# Patient Record
Sex: Female | Born: 1960 | Race: White | Hispanic: No | Marital: Married | Smoking: Never smoker
Health system: Southern US, Community
[De-identification: ages and names within clinical notes are randomized; demographics above are authoritative.]

## PROBLEM LIST (undated history)

## (undated) DIAGNOSIS — F329 Major depressive disorder, single episode, unspecified: Secondary | ICD-10-CM

## (undated) DIAGNOSIS — E039 Hypothyroidism, unspecified: Secondary | ICD-10-CM

## (undated) DIAGNOSIS — F32A Depression, unspecified: Secondary | ICD-10-CM

## (undated) DIAGNOSIS — G4730 Sleep apnea, unspecified: Secondary | ICD-10-CM

## (undated) HISTORY — PX: ABDOMINAL HYSTERECTOMY: SHX81

---

## 1999-02-06 ENCOUNTER — Other Ambulatory Visit: Admission: RE | Admit: 1999-02-06 | Discharge: 1999-02-06 | Payer: Self-pay | Admitting: *Deleted

## 2000-02-09 ENCOUNTER — Ambulatory Visit (HOSPITAL_COMMUNITY): Admission: RE | Admit: 2000-02-09 | Discharge: 2000-02-09 | Payer: Self-pay | Admitting: Gastroenterology

## 2000-04-25 ENCOUNTER — Other Ambulatory Visit: Admission: RE | Admit: 2000-04-25 | Discharge: 2000-04-25 | Payer: Self-pay | Admitting: *Deleted

## 2001-05-15 ENCOUNTER — Other Ambulatory Visit: Admission: RE | Admit: 2001-05-15 | Discharge: 2001-05-15 | Payer: Self-pay | Admitting: *Deleted

## 2002-05-08 ENCOUNTER — Other Ambulatory Visit: Admission: RE | Admit: 2002-05-08 | Discharge: 2002-05-08 | Payer: Self-pay | Admitting: *Deleted

## 2005-05-29 ENCOUNTER — Other Ambulatory Visit: Admission: RE | Admit: 2005-05-29 | Discharge: 2005-05-29 | Payer: Self-pay | Admitting: Obstetrics and Gynecology

## 2005-12-05 ENCOUNTER — Observation Stay (HOSPITAL_COMMUNITY): Admission: RE | Admit: 2005-12-05 | Discharge: 2005-12-06 | Payer: Self-pay | Admitting: Obstetrics and Gynecology

## 2007-04-09 ENCOUNTER — Encounter: Admission: RE | Admit: 2007-04-09 | Discharge: 2007-04-09 | Payer: Self-pay | Admitting: Obstetrics and Gynecology

## 2012-12-03 HISTORY — PX: REDUCTION MAMMAPLASTY: SUR839

## 2014-03-01 ENCOUNTER — Encounter (HOSPITAL_COMMUNITY): Payer: Self-pay | Admitting: Pharmacy Technician

## 2014-03-01 NOTE — Pre-Procedure Instructions (Signed)
Aleisha Paone  03/01/2014  Your procedure is scheduled on:  Thursday, April 9th   Report to [facility]  2 * 3 at  5:30 AM.   Call this number if you have problems the morning of surgery: [phone]   Remember:   Do not eat food or drink liquids after midnight Wednesday.   Take these medicines the morning of surgery with A SIP OF WATER: Cymbalta, Levothyroxine, Zyrtec   Do not wear jewelry, make-up or nail polish.  Do not wear lotions, powders, or perfumes. You may NOT wear deodorant.  Do not shave underarms & legs 48 hours prior to surgery.    Do not bring valuables to the hospital.  [facility] is not responsible for any belongings or valuables.               Contacts, dentures or bridgework may not be worn into surgery.  Leave suitcase in the car. After surgery it may be brought to your room.  For patients admitted to the hospital, discharge time is determined by your                treatment team.              Name and phone number of your driver:    Special Instructions:  "Preparing for Surgery" instruction sheet.   Please read over the following fact sheets that you were given: Pain Booklet and Surgical Site Infection Prevention

## 2014-03-02 ENCOUNTER — Encounter (HOSPITAL_COMMUNITY)
Admission: RE | Admit: 2014-03-02 | Discharge: 2014-03-02 | Disposition: A | Discharge status: Home / Self Care | Payer: 59 | Source: Ambulatory Visit | Attending: Plastic Surgery | Admitting: Plastic Surgery

## 2014-03-02 ENCOUNTER — Encounter (HOSPITAL_COMMUNITY): Payer: Self-pay

## 2014-03-02 DIAGNOSIS — Z01812 Encounter for preprocedural laboratory examination: Secondary | ICD-10-CM | POA: Insufficient documentation

## 2014-03-02 HISTORY — DX: Sleep apnea, unspecified: G47.30

## 2014-03-02 HISTORY — DX: Major depressive disorder, single episode, unspecified: F32.9

## 2014-03-02 HISTORY — DX: Depression, unspecified: F32.A

## 2014-03-02 HISTORY — DX: Hypothyroidism, unspecified: E03.9

## 2014-03-02 LAB — COMPREHENSIVE METABOLIC PANEL
ALBUMIN: 3.5 g/dL (ref 3.5–5.2)
ALT: 35 U/L (ref 0–35)
AST: 22 U/L (ref 0–37)
Alkaline Phosphatase: 87 U/L (ref 39–117)
BILIRUBIN TOTAL: 0.3 mg/dL (ref 0.3–1.2)
BUN: 12 mg/dL (ref 6–23)
CHLORIDE: 103 meq/L (ref 96–112)
CO2: 26 meq/L (ref 19–32)
CREATININE: 0.62 mg/dL (ref 0.50–1.10)
Calcium: 9.5 mg/dL (ref 8.4–10.5)
GFR calc Af Amer: >90 mL/min (ref >90)
Glucose, Bld: 127 mg/dL — ABNORMAL HIGH (ref 70–99)
Potassium: 4.5 mEq/L (ref 3.7–5.3)
SODIUM: 140 meq/L (ref 137–147)
Total Protein: 7.4 g/dL (ref 6.0–8.3)

## 2014-03-02 LAB — CBC
HCT: 39 % (ref 36.0–46.0)
Hemoglobin: 12.7 g/dL (ref 12.0–15.0)
MCH: 28.7 pg (ref 26.0–34.0)
MCHC: 32.6 g/dL (ref 30.0–36.0)
MCV: 88.2 fL (ref 78.0–100.0)
PLATELETS: 308 10*3/uL (ref 150–400)
RBC: 4.42 MIL/uL (ref 3.87–5.11)
RDW: 13.1 % (ref 11.5–15.5)
WBC: 8.9 10*3/uL (ref 4.0–10.5)

## 2014-03-02 NOTE — Progress Notes (Addendum)
Patient had sleep study testing about 6-7 yrs ago.  "Somewhere in [place]"  Instructed her to bring mask. She mainly sees Dr. Forde Dandy for any health issues. Denies any cardiac issues.  I had no orders at PAT appointment.   DA

## 2014-03-03 ENCOUNTER — Other Ambulatory Visit: Payer: Self-pay | Admitting: Plastic Surgery

## 2014-03-10 ENCOUNTER — Encounter (HOSPITAL_COMMUNITY): Payer: Self-pay | Admitting: Anesthesiology

## 2014-03-10 MED ORDER — CEFAZOLIN SODIUM-DEXTROSE 2-3 GM-% IV SOLR
2.0000 g | INTRAVENOUS | Status: AC
  Administered 2014-03-11: 2 g via INTRAVENOUS
  Filled 2014-03-10: qty 50

## 2014-03-10 MED ORDER — CHLORHEXIDINE GLUCONATE 4 % EX LIQD
1.0000 "application " | Freq: Once | CUTANEOUS | Status: DC
  Filled 2014-03-10: qty 15

## 2014-03-10 MED ORDER — HEPARIN SODIUM (PORCINE) 5000 UNIT/ML IJ SOLN
5000.0000 [IU] | Freq: Once | INTRAMUSCULAR | Status: AC
  Administered 2014-03-11: 5000 [IU] via SUBCUTANEOUS
  Filled 2014-03-10: qty 1

## 2014-03-11 ENCOUNTER — Ambulatory Visit (HOSPITAL_COMMUNITY)
Admission: RE | Admit: 2014-03-11 | Discharge: 2014-03-12 | Disposition: A | Discharge status: Home / Self Care | Payer: 59 | Source: Ambulatory Visit | Attending: Plastic Surgery | Admitting: Plastic Surgery

## 2014-03-11 ENCOUNTER — Encounter (HOSPITAL_COMMUNITY)
Admission: RE | Disposition: A | Discharge status: Home / Self Care | Payer: Self-pay | Source: Ambulatory Visit | Attending: Plastic Surgery

## 2014-03-11 ENCOUNTER — Ambulatory Visit (HOSPITAL_COMMUNITY): Payer: 59 | Admitting: Anesthesiology

## 2014-03-11 ENCOUNTER — Encounter (HOSPITAL_COMMUNITY): Payer: 59 | Admitting: Anesthesiology

## 2014-03-11 ENCOUNTER — Encounter (HOSPITAL_COMMUNITY): Payer: Self-pay | Admitting: *Deleted

## 2014-03-11 DIAGNOSIS — N62 Hypertrophy of breast: Secondary | ICD-10-CM | POA: Diagnosis present

## 2014-03-11 DIAGNOSIS — E669 Obesity, unspecified: Secondary | ICD-10-CM | POA: Insufficient documentation

## 2014-03-11 HISTORY — PX: BREAST REDUCTION SURGERY: SHX8

## 2014-03-11 SURGERY — MAMMOPLASTY, REDUCTION
Anesthesia: General | Site: Breast | Laterality: Bilateral

## 2014-03-11 MED ORDER — DEXTROSE-NACL 5-0.45 % IV SOLN
INTRAVENOUS | Status: DC
  Administered 2014-03-11 – 2014-03-12 (×2): via INTRAVENOUS

## 2014-03-11 MED ORDER — DOCUSATE SODIUM 100 MG PO CAPS
100.0000 mg | ORAL_CAPSULE | Freq: Every day | ORAL | Status: DC
  Administered 2014-03-12: 100 mg via ORAL
  Filled 2014-03-11: qty 1

## 2014-03-11 MED ORDER — ROCURONIUM BROMIDE 100 MG/10ML IV SOLN
INTRAVENOUS | Status: DC | PRN
  Administered 2014-03-11: 50 mg via INTRAVENOUS

## 2014-03-11 MED ORDER — NEOSTIGMINE METHYLSULFATE 1 MG/ML IJ SOLN
INTRAMUSCULAR | Status: AC
  Filled 2014-03-11: qty 10

## 2014-03-11 MED ORDER — MIDAZOLAM HCL 2 MG/2ML IJ SOLN
INTRAMUSCULAR | Status: AC
  Filled 2014-03-11: qty 2

## 2014-03-11 MED ORDER — ONDANSETRON HCL 4 MG/2ML IJ SOLN
INTRAMUSCULAR | Status: AC
  Filled 2014-03-11: qty 2

## 2014-03-11 MED ORDER — PROPOFOL 10 MG/ML IV BOLUS
INTRAVENOUS | Status: DC | PRN
  Administered 2014-03-11: 150 mg via INTRAVENOUS

## 2014-03-11 MED ORDER — LEVOTHYROXINE SODIUM 200 MCG PO TABS
200.0000 ug | ORAL_TABLET | Freq: Every day | ORAL | Status: DC
  Administered 2014-03-12: 200 ug via ORAL
  Filled 2014-03-11 (×2): qty 1

## 2014-03-11 MED ORDER — HYDROMORPHONE HCL 2 MG PO TABS
4.0000 mg | ORAL_TABLET | Freq: Once | ORAL | Status: AC
  Administered 2014-03-11: 4 mg via ORAL
  Filled 2014-03-11: qty 2

## 2014-03-11 MED ORDER — ONDANSETRON HCL 4 MG/2ML IJ SOLN
INTRAMUSCULAR | Status: DC | PRN
  Administered 2014-03-11: 4 mg via INTRAVENOUS

## 2014-03-11 MED ORDER — SUCCINYLCHOLINE CHLORIDE 20 MG/ML IJ SOLN
INTRAMUSCULAR | Status: AC
  Filled 2014-03-11: qty 1

## 2014-03-11 MED ORDER — HYDROMORPHONE HCL PF 1 MG/ML IJ SOLN
INTRAMUSCULAR | Status: AC
  Filled 2014-03-11: qty 1

## 2014-03-11 MED ORDER — PROMETHAZINE HCL 25 MG/ML IJ SOLN
6.2500 mg | INTRAMUSCULAR | Status: DC | PRN
  Administered 2014-03-12: 6.25 mg via INTRAVENOUS
  Filled 2014-03-11: qty 1

## 2014-03-11 MED ORDER — NEOSTIGMINE METHYLSULFATE 1 MG/ML IJ SOLN
INTRAMUSCULAR | Status: DC | PRN
  Administered 2014-03-11: 3 mg via INTRAVENOUS

## 2014-03-11 MED ORDER — GLYCOPYRROLATE 0.2 MG/ML IJ SOLN
INTRAMUSCULAR | Status: DC | PRN
  Administered 2014-03-11: 0.4 mg via INTRAVENOUS

## 2014-03-11 MED ORDER — LACTATED RINGERS IV SOLN
INTRAVENOUS | Status: DC | PRN
  Administered 2014-03-11 (×2): via INTRAVENOUS

## 2014-03-11 MED ORDER — ROCURONIUM BROMIDE 50 MG/5ML IV SOLN
INTRAVENOUS | Status: AC
  Filled 2014-03-11: qty 1

## 2014-03-11 MED ORDER — HYDROMORPHONE HCL PF 1 MG/ML IJ SOLN
0.2500 mg | INTRAMUSCULAR | Status: DC | PRN
  Administered 2014-03-11: 0.5 mg via INTRAVENOUS
  Administered 2014-03-11: 1 mg via INTRAVENOUS

## 2014-03-11 MED ORDER — MIDAZOLAM HCL 5 MG/5ML IJ SOLN
INTRAMUSCULAR | Status: DC | PRN
  Administered 2014-03-11: 2 mg via INTRAVENOUS

## 2014-03-11 MED ORDER — OXYCODONE HCL 5 MG PO TABS
ORAL_TABLET | ORAL | Status: AC
  Filled 2014-03-11: qty 1

## 2014-03-11 MED ORDER — OXYCODONE HCL 5 MG/5ML PO SOLN
5.0000 mg | Freq: Once | ORAL | Status: AC | PRN
  Administered 2014-03-11: 5 mg via ORAL

## 2014-03-11 MED ORDER — LIDOCAINE HCL (CARDIAC) 20 MG/ML IV SOLN
INTRAVENOUS | Status: AC
  Filled 2014-03-11: qty 5

## 2014-03-11 MED ORDER — DEXAMETHASONE SODIUM PHOSPHATE 4 MG/ML IJ SOLN
INTRAMUSCULAR | Status: DC | PRN
  Administered 2014-03-11: 4 mg via INTRAVENOUS

## 2014-03-11 MED ORDER — 0.9 % SODIUM CHLORIDE (POUR BTL) OPTIME
TOPICAL | Status: DC | PRN
  Administered 2014-03-11 (×3): 1000 mL

## 2014-03-11 MED ORDER — CEFAZOLIN SODIUM 1-5 GM-% IV SOLN
1.0000 g | Freq: Four times a day (QID) | INTRAVENOUS | Status: DC
  Administered 2014-03-11 – 2014-03-12 (×3): 1 g via INTRAVENOUS
  Filled 2014-03-11 (×6): qty 50

## 2014-03-11 MED ORDER — METHOCARBAMOL 500 MG PO TABS
ORAL_TABLET | ORAL | Status: AC
  Filled 2014-03-11: qty 1

## 2014-03-11 MED ORDER — FENTANYL CITRATE 0.05 MG/ML IJ SOLN
INTRAMUSCULAR | Status: DC | PRN
  Administered 2014-03-11: 50 ug via INTRAVENOUS
  Administered 2014-03-11: 100 ug via INTRAVENOUS
  Administered 2014-03-11 (×2): 50 ug via INTRAVENOUS

## 2014-03-11 MED ORDER — ONDANSETRON HCL 4 MG/2ML IJ SOLN: 4.0000 mg | Freq: Once | INTRAMUSCULAR | Status: DC | PRN

## 2014-03-11 MED ORDER — METHOCARBAMOL 500 MG PO TABS
500.0000 mg | ORAL_TABLET | Freq: Four times a day (QID) | ORAL | Status: DC | PRN
  Administered 2014-03-11: 500 mg via ORAL

## 2014-03-11 MED ORDER — LIDOCAINE HCL (CARDIAC) 20 MG/ML IV SOLN
INTRAVENOUS | Status: DC | PRN
  Administered 2014-03-11: 40 mg via INTRAVENOUS

## 2014-03-11 MED ORDER — PROPOFOL 10 MG/ML IV BOLUS
INTRAVENOUS | Status: AC
  Filled 2014-03-11: qty 20

## 2014-03-11 MED ORDER — OXYCODONE HCL 5 MG PO TABS: 5.0000 mg | ORAL_TABLET | Freq: Once | ORAL | Status: AC | PRN

## 2014-03-11 MED ORDER — FENTANYL CITRATE 0.05 MG/ML IJ SOLN
INTRAMUSCULAR | Status: AC
  Filled 2014-03-11: qty 5

## 2014-03-11 MED ORDER — DULOXETINE HCL 60 MG PO CPEP
60.0000 mg | ORAL_CAPSULE | Freq: Every day | ORAL | Status: DC
  Administered 2014-03-12: 60 mg via ORAL
  Filled 2014-03-11: qty 1

## 2014-03-11 MED ORDER — HYDROMORPHONE HCL 2 MG PO TABS
2.0000 mg | ORAL_TABLET | ORAL | Status: DC | PRN
  Administered 2014-03-11: 2 mg via ORAL
  Administered 2014-03-12 (×3): 4 mg via ORAL
  Filled 2014-03-11: qty 1
  Filled 2014-03-11 (×3): qty 2

## 2014-03-11 MED ORDER — GLYCOPYRROLATE 0.2 MG/ML IJ SOLN
INTRAMUSCULAR | Status: AC
  Filled 2014-03-11: qty 2

## 2014-03-11 MED ORDER — ATORVASTATIN CALCIUM 40 MG PO TABS
40.0000 mg | ORAL_TABLET | Freq: Every day | ORAL | Status: DC
  Filled 2014-03-11: qty 1

## 2014-03-11 MED ORDER — ALBUTEROL SULFATE HFA 108 (90 BASE) MCG/ACT IN AERS
INHALATION_SPRAY | RESPIRATORY_TRACT | Status: DC | PRN
  Administered 2014-03-11: 3 via RESPIRATORY_TRACT
  Administered 2014-03-11: 2 via RESPIRATORY_TRACT

## 2014-03-11 SURGICAL SUPPLY — 61 items
ADH SKN CLS APL DERMABOND .7 (GAUZE/BANDAGES/DRESSINGS) ×2
ADH SKN CLS LQ APL DERMABOND (GAUZE/BANDAGES/DRESSINGS) ×1
ATCH SMKEVC FLXB CAUT HNDSWH (FILTER) ×1 IMPLANT
BALL CTTN LRG ABS STRL LF (GAUZE/BANDAGES/DRESSINGS) ×1
BANDAGE ELASTIC 6 VELCRO ST LF (GAUZE/BANDAGES/DRESSINGS) ×1 IMPLANT
BINDER BREAST LRG (GAUZE/BANDAGES/DRESSINGS) IMPLANT
BINDER BREAST XLRG (GAUZE/BANDAGES/DRESSINGS) IMPLANT
BLADE 10 SAFETY STRL DISP (BLADE) ×1 IMPLANT
BNDG CMPR MED 10X6 ELC LF (GAUZE/BANDAGES/DRESSINGS) ×1
BNDG ELASTIC 6X10 VLCR STRL LF (GAUZE/BANDAGES/DRESSINGS) ×1 IMPLANT
CANISTER SUCTION 2500CC (MISCELLANEOUS) ×2 IMPLANT
CHLORAPREP W/TINT 26ML (MISCELLANEOUS) ×3 IMPLANT
COTTONBALL LRG STERILE PKG (GAUZE/BANDAGES/DRESSINGS) ×1 IMPLANT
COVER SURGICAL LIGHT HANDLE (MISCELLANEOUS) ×2 IMPLANT
DERMABOND ADHESIVE PROPEN (GAUZE/BANDAGES/DRESSINGS) ×1
DERMABOND ADVANCED (GAUZE/BANDAGES/DRESSINGS) ×2
DERMABOND ADVANCED .7 DNX12 (GAUZE/BANDAGES/DRESSINGS) ×1 IMPLANT
DERMABOND ADVANCED .7 DNX6 (GAUZE/BANDAGES/DRESSINGS) IMPLANT
DRAPE ORTHO SPLIT 77X108 STRL (DRAPES) ×4
DRAPE PROXIMA HALF (DRAPES) ×4 IMPLANT
DRAPE SURG ORHT 6 SPLT 77X108 (DRAPES) ×2 IMPLANT
DRAPE WARM FLUID 44X44 (DRAPE) ×1 IMPLANT
DRSG PAD ABDOMINAL 8X10 ST (GAUZE/BANDAGES/DRESSINGS) ×4 IMPLANT
ELECT CAUTERY BLADE 6.4 (BLADE) ×2 IMPLANT
ELECT REM PT RETURN 9FT ADLT (ELECTROSURGICAL) ×2
ELECTRODE REM PT RTRN 9FT ADLT (ELECTROSURGICAL) ×1 IMPLANT
EVACUATOR SMOKE ACCUVAC VALLEY (FILTER) ×1
GAUZE XEROFORM 5X9 LF (GAUZE/BANDAGES/DRESSINGS) ×1 IMPLANT
GLOVE BIO SURGEON STRL SZ7.5 (GLOVE) ×2 IMPLANT
GLOVE BIOGEL PI IND STRL 6.5 (GLOVE) IMPLANT
GLOVE BIOGEL PI IND STRL 7.0 (GLOVE) IMPLANT
GLOVE BIOGEL PI IND STRL 8 (GLOVE) ×1 IMPLANT
GLOVE BIOGEL PI INDICATOR 6.5 (GLOVE) ×1
GLOVE BIOGEL PI INDICATOR 7.0 (GLOVE) ×2
GLOVE BIOGEL PI INDICATOR 8 (GLOVE) ×1
GLOVE ECLIPSE 6.5 STRL STRAW (GLOVE) ×1 IMPLANT
GLOVE SKINSENSE NS SZ6.5 (GLOVE) ×2
GLOVE SKINSENSE STRL SZ6.5 (GLOVE) IMPLANT
GOWN STRL REUS W/ TWL LRG LVL3 (GOWN DISPOSABLE) ×1 IMPLANT
GOWN STRL REUS W/ TWL XL LVL3 (GOWN DISPOSABLE) ×1 IMPLANT
GOWN STRL REUS W/TWL LRG LVL3 (GOWN DISPOSABLE) ×6
GOWN STRL REUS W/TWL XL LVL3 (GOWN DISPOSABLE) ×6
KIT BASIN OR (CUSTOM PROCEDURE TRAY) ×2 IMPLANT
KIT ROOM TURNOVER OR (KITS) ×2 IMPLANT
MARKER SKIN DUAL TIP RULER LAB (MISCELLANEOUS) ×2 IMPLANT
NS IRRIG 1000ML POUR BTL (IV SOLUTION) ×4 IMPLANT
PACK GENERAL/GYN (CUSTOM PROCEDURE TRAY) ×2 IMPLANT
PAD ABD 8X10 STRL (GAUZE/BANDAGES/DRESSINGS) ×6 IMPLANT
PAD ARMBOARD 7.5X6 YLW CONV (MISCELLANEOUS) ×2 IMPLANT
PREFILTER EVAC NS 1 1/3-3/8IN (MISCELLANEOUS) ×2 IMPLANT
SPONGE GAUZE 4X4 12PLY (GAUZE/BANDAGES/DRESSINGS) ×1 IMPLANT
SPONGE LAP 18X18 X RAY DECT (DISPOSABLE) ×2 IMPLANT
STRIP CLOSURE SKIN 1/2X4 (GAUZE/BANDAGES/DRESSINGS) IMPLANT
SUT MNCRL AB 3-0 PS2 18 (SUTURE) ×8 IMPLANT
SUT MON AB 2-0 CT1 36 (SUTURE) ×8 IMPLANT
SUT PROLENE 5 0 PS 2 (SUTURE) ×4 IMPLANT
SUT SILK 4 0 PS 2 (SUTURE) ×6 IMPLANT
TOWEL OR 17X24 6PK STRL BLUE (TOWEL DISPOSABLE) ×2 IMPLANT
TOWEL OR 17X26 10 PK STRL BLUE (TOWEL DISPOSABLE) ×2 IMPLANT
TRAY FOLEY CATH 16FR SILVER (SET/KITS/TRAYS/PACK) ×1 IMPLANT
TRAY FOLEY CATH 16FRSI W/METER (SET/KITS/TRAYS/PACK) IMPLANT

## 2014-03-11 NOTE — Brief Op Note (Signed)
03/11/2014  11:26 AM  PATIENT:  Julie Jones  53 y.o. female  PRE-OPERATIVE DIAGNOSIS:  BREAST HYPERTROPHY BILATERAL   POST-OPERATIVE DIAGNOSIS:  bilateral breast hypertrophy  PROCEDURE:  Procedure(s): BILATERAL MAMMARY REDUCTION  (BREAST) (Bilateral)  SURGEON:  Surgeon(s) and Role:    * Crissie Reese, MD - Primary  PHYSICIAN ASSISTANT:   ASSISTANTS: Diane, RNFA with vascular team   ANESTHESIA:   general  EBL:  Total I/O In: 1000 [I.V.:1000] Out: 450 [Urine:300; Blood:150]  BLOOD ADMINISTERED:none  DRAINS: none   LOCAL MEDICATIONS USED:  NONE  SPECIMEN:  Source of Specimen:  Bilateral breast tissue  DISPOSITION OF SPECIMEN:  PATHOLOGY  COUNTS:  YES  TOURNIQUET:  * No tourniquets in log *  DICTATION: .Other Dictation: Dictation Number [id number]  PLAN OF CARE: Admit for overnight observation  PATIENT DISPOSITION:  PACU - hemodynamically stable.   Delay start of Pharmacological VTE agent (>24hrs) due to surgical blood loss or risk of bleeding: no (she received heparin subcu pre-op)

## 2014-03-11 NOTE — Transfer of Care (Signed)
Immediate Anesthesia Transfer of Care Note  Patient: Julie Jones  Procedure(s) Performed: Procedure(s): BILATERAL MAMMARY REDUCTION  (BREAST) (Bilateral)  Patient Location: PACU  Anesthesia Type:General  Level of Consciousness: awake, alert  and oriented  Airway & Oxygen Therapy: Patient Spontanous Breathing and Patient connected to nasal cannula oxygen  Post-op Assessment: Report given to PACU RN, Post -op Vital signs reviewed and stable and Patient moving all extremities X 4  Post vital signs: Reviewed and stable  Complications: No apparent anesthesia complications

## 2014-03-11 NOTE — Op Note (Signed)
Julie Jones, KARDELL NO.:  [id number]  MEDICAL RECORD NO.:  [id number]  LOCATION:  6N06C                        FACILITY:  [facility]  PHYSICIAN:  Crissie Reese, M.D.     DATE OF BIRTH:  1960/12/26  DATE OF PROCEDURE:  03/11/2014 DATE OF DISCHARGE:                              OPERATIVE REPORT   PREOPERATIVE DIAGNOSIS:  Bilateral macromastia.  POSTOPERATIVE DIAGNOSIS:  Bilateral macromastia.  PROCEDURE PERFORMED:  Bilateral breast reduction with free nipple graft technique.  SURGEON:  Crissie Reese, MD  ASSISTANT:  Diane, RNFA from cardiovascular team.  ESTIMATED BLOOD LOSS:  150 mL.  CLINICAL NOTE:  This 53 year old woman complains of very large breasts with upper back pain, neck pain, shoulder pain, and bra strap shoulder grooving.  She desires a breast reduction.  She was quite large in nipple notch distance 40 cm.  It was felt that a free nipple graft was a preferred option for her.  She desired removal of approximately half of her breast tissue.  The nature of these procedures and risks and possible complications and overall complexes were discussed with her in great detail.  The risks included, but not limited to, bleeding, infection, healing problems, scarring, loss of sensation, loss of sensation in the nipple, fluid accumulations, anesthesia complications, PE and DVT, asymmetry, failure to relieve symptoms, chronic pain, and overall disappointment as well as altered body image and she understood all this and wished to proceed.  The estimate of breast size was approximately 2600 g and  estimated removal of around 1300 g to give her relief of symptoms.  She also understood those possibility of loss of color in the nipple areolar complexes as well as loss of the grafted nipples.  She wished to proceed.  DESCRIPTION OF PROCEDURE:  The patient has marked in the holding area in full standing position.  She was taken to the operating room and  placed supine.  After successful induction of general anesthesia, she was prepped with ChloraPrep and after waiting full 3 minutes for drying, she was draped with sterile drapes.  The 38 mm marker was used to mark the nipple-areolar complexes.  These were harvested and labeled right and left, and placed in saline soaked gauze.  The area centrally was de- epithelialized for central projection and then incisions were made, and the excess breast tissue was amputated from the inferior aspect of the breast using electrocautery.  Meticulous hemostasis was maintained using electrocautery.  A total of 1298 g from the left side, 1299 g from the right side.  Thorough irrigation with saline and meticulous hemostasis was achieved using electrocautery.  Excellent hemostasis had been achieved.  The closure was with 2-0 Monocryl interrupted inverted deep dermal sutures, followed by 3-0 Monocryl running subcuticular suture.  A measurement was then taken to 5 cm up from the inframammary crease and the 38 mm marker marked the site for the nipple-areolar complex.  This tissue was deepithelialized.  The nipple grafts were defaded carefully. They were then applied and 4-0 silk sutures were then placed around the periphery with 1 suture left along for a tie-over bolster.  These having been positioned, the under surface of the grafts flushed  with saline in order to ensure no blood or debris and then the 5-0 Prolene simple running suture was placed around the periphery.  Saline soaked cotton balls were placed in a Xeroform gauze and uses a tie-over bolster which was tied securely over this. Dermabond used to seal the wounds.  Dry sterile dressings and a circumferential Ace wrap applied, and she was transported to the recovery room in stable, having tolerated the procedure well.     Crissie Reese, M.D.     DB/MEDQ  D:  03/11/2014  T:  03/11/2014  Job:  [id number]

## 2014-03-11 NOTE — Anesthesia Preprocedure Evaluation (Signed)
Anesthesia Evaluation  Patient identified by MRN, date of birth, ID band Patient awake    Reviewed: Allergy & Precautions, H&P , NPO status , Patient's Chart, lab work & pertinent test results  Airway Mallampati: II TM Distance: >3 FB Neck ROM: Full    Dental  (+) Teeth Intact, Dental Advisory Given   Pulmonary  breath sounds clear to auscultation        Cardiovascular Rhythm:Regular Rate:Normal     Neuro/Psych    GI/Hepatic   Endo/Other    Renal/GU      Musculoskeletal   Abdominal (+) + obese,   Peds  Hematology   Anesthesia Other Findings   Reproductive/Obstetrics                           Anesthesia Physical Anesthesia Plan  ASA: II  Anesthesia Plan: General   Post-op Pain Management:    Induction: Intravenous  Airway Management Planned: Oral ETT  Additional Equipment:   Intra-op Plan:   Post-operative Plan: Extubation in OR  Informed Consent: I have reviewed the patients History and Physical, chart, labs and discussed the procedure including the risks, benefits and alternatives for the proposed anesthesia with the patient or authorized representative who has indicated his/her understanding and acceptance.   Dental advisory given  Plan Discussed with: CRNA and Anesthesiologist  Anesthesia Plan Comments: (Obesity Sleep apnea on CPAP  Plan GA with oral ETT  Roberts Gaudy, MD)        Anesthesia Quick Evaluation

## 2014-03-11 NOTE — Anesthesia Postprocedure Evaluation (Signed)
  Anesthesia Post-op Note  Patient: Julie Jones  Procedure(s) Performed: Procedure(s): BILATERAL MAMMARY REDUCTION  (BREAST) (Bilateral)  Patient Location: PACU  Anesthesia Type:General  Level of Consciousness: awake, alert  and oriented  Airway and Oxygen Therapy: Patient Spontanous Breathing and Patient connected to nasal cannula oxygen  Post-op Pain: mild  Post-op Assessment: Post-op Vital signs reviewed, Patient's Cardiovascular Status Stable, Respiratory Function Stable, Patent Airway and Pain level controlled  Post-op Vital Signs: stable  Last Vitals:  Filed Vitals:   03/11/14 1353  BP: 156/78  Pulse: 104  Temp: 36.6 C  Resp: 14    Complications: No apparent anesthesia complications

## 2014-03-11 NOTE — H&P (Signed)
I have re-examined and re-evaluated the patient and there are no changes. See office notes in paper chart.  Planned procedure: Bilateral breast reduction with free nipple graft technique.

## 2014-03-12 MED ORDER — DSS 100 MG PO CAPS: 100.0000 mg | ORAL_CAPSULE | Freq: Every day | ORAL | Status: AC

## 2014-03-12 MED ORDER — HYDROMORPHONE HCL 2 MG PO TABS: 2.0000 mg | ORAL_TABLET | ORAL | Status: AC | PRN

## 2014-03-12 MED ORDER — METHOCARBAMOL 500 MG PO TABS: 500.0000 mg | ORAL_TABLET | Freq: Four times a day (QID) | ORAL | Status: AC | PRN

## 2014-03-12 NOTE — Discharge Planning (Signed)
Patient discharged home in stable condition. Verbalizes understanding of all discharge instructions, including home medications and follow up appointments. 

## 2014-03-12 NOTE — Discharge Instructions (Addendum)
No lifting for 6 weeks No vigorous activity for 6 weeks (including outdoor walks) No driving for 4 weeks OK to walk up stairs slowly Stay propped up Use incentive spirometer at home every hour while awake No shower  Leave the ace wrap in place. It is OK to re-wrap it if it slips. Take an over-the-counter Probiotic while on antibiotics Take an over-the-counter stool softener (such as Colace) while on pain medication Return to office next week For questions call [phone] or [phone]

## 2014-03-12 NOTE — Discharge Summary (Signed)
Physician Discharge Summary  Patient ID: Julie Jones MRN: [id number] DOB/AGE: [birth year] 53 y.o.  Admit date: 03/11/2014 Discharge date: 03/12/2014  Admission Diagnoses:Breast hypertrophy  Discharge Diagnoses: Same Active Problems:   Breast hypertrophy   Discharged Condition: good  Hospital Course: On the day of admission the patient was taken to surgery and had bilateral breast reduction with free nipple graft technique. The patient tolerated the procedures well. The patient was ambulatory and tolerating diet on the first postoperative day. No evidence of bleeding or infection. Chest soft..  Treatments: antibiotics: Ancef, anticoagulation: heparin (pre-op) and surgery: bilateral breast reduction  Discharge Exam: Blood pressure 123/68, pulse 101, temperature 98.4 F (36.9 C), temperature source Oral, resp. rate 16, SpO2 95.00%.  Operative sites: Chest soft bilateral. No evidence of bleeding or infectio.  [place] home. See in office next week.      Medication List    STOP taking these medications       cetirizine 10 MG tablet  Commonly known as:  ZYRTEC     GLUCOSAMINE CHONDROITIN JOINT PO      TAKE these medications       DSS 100 MG Caps  Take 100 mg by mouth daily.     DULoxetine 60 MG capsule  Commonly known as:  CYMBALTA  Take 60 mg by mouth daily.     HYDROmorphone 2 MG tablet  Commonly known as:  DILAUDID  Take 1-2 tablets (2-4 mg total) by mouth every 4 (four) hours as needed for severe pain.     levothyroxine 200 MCG tablet  Commonly known as:  SYNTHROID, LEVOTHROID  Take 200 mcg by mouth daily before breakfast.     methocarbamol 500 MG tablet  Commonly known as:  ROBAXIN  Take 1 tablet (500 mg total) by mouth every 6 (six) hours as needed for muscle spasms.     rosuvastatin 20 MG tablet  Commonly known as:  CRESTOR  Take 20 mg by mouth daily.         Signed: Crissie Reese 03/12/2014, 11:42 AM

## 2014-03-15 ENCOUNTER — Encounter (HOSPITAL_COMMUNITY): Payer: Self-pay | Admitting: Plastic Surgery

## 2016-12-17 DIAGNOSIS — G4733 Obstructive sleep apnea (adult) (pediatric): Secondary | ICD-10-CM | POA: Diagnosis not present

## 2016-12-28 DIAGNOSIS — G4733 Obstructive sleep apnea (adult) (pediatric): Secondary | ICD-10-CM | POA: Diagnosis not present

## 2017-01-29 DIAGNOSIS — G4733 Obstructive sleep apnea (adult) (pediatric): Secondary | ICD-10-CM | POA: Diagnosis not present

## 2017-02-15 DIAGNOSIS — Z79899 Other long term (current) drug therapy: Secondary | ICD-10-CM | POA: Diagnosis not present

## 2017-02-15 DIAGNOSIS — G4733 Obstructive sleep apnea (adult) (pediatric): Secondary | ICD-10-CM | POA: Diagnosis not present

## 2017-05-02 DIAGNOSIS — G4733 Obstructive sleep apnea (adult) (pediatric): Secondary | ICD-10-CM | POA: Diagnosis not present

## 2017-05-02 DIAGNOSIS — E1165 Type 2 diabetes mellitus with hyperglycemia: Secondary | ICD-10-CM | POA: Diagnosis not present

## 2017-05-02 DIAGNOSIS — R631 Polydipsia: Secondary | ICD-10-CM | POA: Diagnosis not present

## 2017-05-07 DIAGNOSIS — E1165 Type 2 diabetes mellitus with hyperglycemia: Secondary | ICD-10-CM | POA: Diagnosis not present

## 2017-05-22 DIAGNOSIS — G4733 Obstructive sleep apnea (adult) (pediatric): Secondary | ICD-10-CM | POA: Diagnosis not present

## 2017-05-22 DIAGNOSIS — Z79899 Other long term (current) drug therapy: Secondary | ICD-10-CM | POA: Diagnosis not present

## 2017-06-19 DIAGNOSIS — E1165 Type 2 diabetes mellitus with hyperglycemia: Secondary | ICD-10-CM | POA: Diagnosis not present

## 2017-06-19 DIAGNOSIS — E038 Other specified hypothyroidism: Secondary | ICD-10-CM | POA: Diagnosis not present

## 2017-06-19 DIAGNOSIS — E784 Other hyperlipidemia: Secondary | ICD-10-CM | POA: Diagnosis not present

## 2017-07-01 DIAGNOSIS — G4733 Obstructive sleep apnea (adult) (pediatric): Secondary | ICD-10-CM | POA: Diagnosis not present

## 2017-07-03 DIAGNOSIS — E1165 Type 2 diabetes mellitus with hyperglycemia: Secondary | ICD-10-CM | POA: Diagnosis not present

## 2017-07-18 ENCOUNTER — Encounter: Payer: Commercial Managed Care - HMO | Admitting: Genetics

## 2017-07-18 ENCOUNTER — Other Ambulatory Visit: Payer: Commercial Managed Care - HMO

## 2017-08-02 DIAGNOSIS — G4733 Obstructive sleep apnea (adult) (pediatric): Secondary | ICD-10-CM | POA: Diagnosis not present

## 2017-08-21 DIAGNOSIS — Z79899 Other long term (current) drug therapy: Secondary | ICD-10-CM | POA: Diagnosis not present

## 2017-08-21 DIAGNOSIS — E1165 Type 2 diabetes mellitus with hyperglycemia: Secondary | ICD-10-CM | POA: Diagnosis not present

## 2017-08-21 DIAGNOSIS — R740 Nonspecific elevation of levels of transaminase and lactic acid dehydrogenase [LDH]: Secondary | ICD-10-CM | POA: Diagnosis not present

## 2017-10-01 DIAGNOSIS — Z1389 Encounter for screening for other disorder: Secondary | ICD-10-CM | POA: Diagnosis not present

## 2017-10-16 DIAGNOSIS — Z23 Encounter for immunization: Secondary | ICD-10-CM | POA: Diagnosis not present

## 2017-11-04 DIAGNOSIS — G4733 Obstructive sleep apnea (adult) (pediatric): Secondary | ICD-10-CM | POA: Diagnosis not present

## 2017-11-19 DIAGNOSIS — Z79899 Other long term (current) drug therapy: Secondary | ICD-10-CM | POA: Diagnosis not present

## 2017-12-30 DIAGNOSIS — G4733 Obstructive sleep apnea (adult) (pediatric): Secondary | ICD-10-CM | POA: Diagnosis not present

## 2018-01-01 DIAGNOSIS — Z13820 Encounter for screening for osteoporosis: Secondary | ICD-10-CM | POA: Diagnosis not present

## 2018-01-01 DIAGNOSIS — M8588 Other specified disorders of bone density and structure, other site: Secondary | ICD-10-CM | POA: Diagnosis not present

## 2018-01-01 DIAGNOSIS — N958 Other specified menopausal and perimenopausal disorders: Secondary | ICD-10-CM | POA: Diagnosis not present

## 2018-01-01 DIAGNOSIS — Z8371 Family history of colonic polyps: Secondary | ICD-10-CM | POA: Diagnosis not present

## 2018-01-01 DIAGNOSIS — Z800 Family history of malignant neoplasm of digestive organs: Secondary | ICD-10-CM | POA: Diagnosis not present

## 2018-01-01 DIAGNOSIS — Z01419 Encounter for gynecological examination (general) (routine) without abnormal findings: Secondary | ICD-10-CM | POA: Diagnosis not present

## 2018-01-03 ENCOUNTER — Other Ambulatory Visit: Payer: Self-pay | Admitting: Obstetrics and Gynecology

## 2018-01-03 DIAGNOSIS — R928 Other abnormal and inconclusive findings on diagnostic imaging of breast: Secondary | ICD-10-CM

## 2018-01-09 ENCOUNTER — Ambulatory Visit: Payer: Commercial Managed Care - HMO

## 2018-01-09 ENCOUNTER — Ambulatory Visit
Admission: RE | Admit: 2018-01-09 | Discharge: 2018-01-09 | Disposition: A | Discharge status: Home / Self Care | Payer: 59 | Source: Ambulatory Visit | Attending: Obstetrics and Gynecology | Admitting: Obstetrics and Gynecology

## 2018-01-09 DIAGNOSIS — R928 Other abnormal and inconclusive findings on diagnostic imaging of breast: Secondary | ICD-10-CM

## 2018-01-22 DIAGNOSIS — E119 Type 2 diabetes mellitus without complications: Secondary | ICD-10-CM | POA: Diagnosis not present

## 2018-01-22 DIAGNOSIS — Z1389 Encounter for screening for other disorder: Secondary | ICD-10-CM | POA: Diagnosis not present

## 2018-02-04 DIAGNOSIS — G4733 Obstructive sleep apnea (adult) (pediatric): Secondary | ICD-10-CM | POA: Diagnosis not present

## 2018-02-06 DIAGNOSIS — Z79899 Other long term (current) drug therapy: Secondary | ICD-10-CM | POA: Diagnosis not present

## 2018-02-12 DIAGNOSIS — M8588 Other specified disorders of bone density and structure, other site: Secondary | ICD-10-CM | POA: Diagnosis not present

## 2018-02-12 DIAGNOSIS — Z809 Family history of malignant neoplasm, unspecified: Secondary | ICD-10-CM | POA: Diagnosis not present

## 2018-03-10 DIAGNOSIS — Z1211 Encounter for screening for malignant neoplasm of colon: Secondary | ICD-10-CM | POA: Diagnosis not present

## 2018-03-10 DIAGNOSIS — K635 Polyp of colon: Secondary | ICD-10-CM | POA: Diagnosis not present

## 2018-03-10 DIAGNOSIS — Z8371 Family history of colonic polyps: Secondary | ICD-10-CM | POA: Diagnosis not present

## 2018-03-10 DIAGNOSIS — Z800 Family history of malignant neoplasm of digestive organs: Secondary | ICD-10-CM | POA: Diagnosis not present

## 2018-03-10 DIAGNOSIS — D124 Benign neoplasm of descending colon: Secondary | ICD-10-CM | POA: Diagnosis not present

## 2018-03-11 DIAGNOSIS — D124 Benign neoplasm of descending colon: Secondary | ICD-10-CM | POA: Diagnosis not present

## 2018-03-12 ENCOUNTER — Other Ambulatory Visit: Payer: Self-pay | Admitting: Obstetrics and Gynecology

## 2018-05-06 DIAGNOSIS — G4733 Obstructive sleep apnea (adult) (pediatric): Secondary | ICD-10-CM | POA: Diagnosis not present

## 2018-05-08 DIAGNOSIS — Z79899 Other long term (current) drug therapy: Secondary | ICD-10-CM | POA: Diagnosis not present

## 2018-05-08 DIAGNOSIS — G4733 Obstructive sleep apnea (adult) (pediatric): Secondary | ICD-10-CM | POA: Diagnosis not present

## 2018-05-29 DIAGNOSIS — E7849 Other hyperlipidemia: Secondary | ICD-10-CM | POA: Diagnosis not present

## 2018-05-29 DIAGNOSIS — E0500 Thyrotoxicosis with diffuse goiter without thyrotoxic crisis or storm: Secondary | ICD-10-CM | POA: Diagnosis not present

## 2018-05-29 DIAGNOSIS — E119 Type 2 diabetes mellitus without complications: Secondary | ICD-10-CM | POA: Diagnosis not present

## 2018-05-29 DIAGNOSIS — R740 Nonspecific elevation of levels of transaminase and lactic acid dehydrogenase [LDH]: Secondary | ICD-10-CM | POA: Diagnosis not present

## 2018-06-28 DIAGNOSIS — G4733 Obstructive sleep apnea (adult) (pediatric): Secondary | ICD-10-CM | POA: Diagnosis not present

## 2018-08-26 DIAGNOSIS — Z79899 Other long term (current) drug therapy: Secondary | ICD-10-CM | POA: Diagnosis not present

## 2018-08-26 DIAGNOSIS — G4733 Obstructive sleep apnea (adult) (pediatric): Secondary | ICD-10-CM | POA: Diagnosis not present

## 2018-08-27 DIAGNOSIS — Z79899 Other long term (current) drug therapy: Secondary | ICD-10-CM | POA: Diagnosis not present

## 2018-09-09 DIAGNOSIS — Z23 Encounter for immunization: Secondary | ICD-10-CM | POA: Diagnosis not present

## 2018-09-09 DIAGNOSIS — E119 Type 2 diabetes mellitus without complications: Secondary | ICD-10-CM | POA: Diagnosis not present

## 2018-09-09 DIAGNOSIS — Z6838 Body mass index (BMI) 38.0-38.9, adult: Secondary | ICD-10-CM | POA: Diagnosis not present

## 2018-09-09 DIAGNOSIS — E7849 Other hyperlipidemia: Secondary | ICD-10-CM | POA: Diagnosis not present

## 2018-09-11 DIAGNOSIS — G4733 Obstructive sleep apnea (adult) (pediatric): Secondary | ICD-10-CM | POA: Diagnosis not present

## 2018-11-04 DIAGNOSIS — E038 Other specified hypothyroidism: Secondary | ICD-10-CM | POA: Diagnosis not present

## 2018-11-04 DIAGNOSIS — E7849 Other hyperlipidemia: Secondary | ICD-10-CM | POA: Diagnosis not present

## 2018-11-04 DIAGNOSIS — E119 Type 2 diabetes mellitus without complications: Secondary | ICD-10-CM | POA: Diagnosis not present

## 2018-11-19 DIAGNOSIS — Z79899 Other long term (current) drug therapy: Secondary | ICD-10-CM | POA: Diagnosis not present

## 2018-12-25 DIAGNOSIS — G4733 Obstructive sleep apnea (adult) (pediatric): Secondary | ICD-10-CM | POA: Diagnosis not present

## 2019-01-06 DIAGNOSIS — E119 Type 2 diabetes mellitus without complications: Secondary | ICD-10-CM | POA: Diagnosis not present

## 2019-01-30 DIAGNOSIS — H10413 Chronic giant papillary conjunctivitis, bilateral: Secondary | ICD-10-CM | POA: Diagnosis not present

## 2019-02-18 DIAGNOSIS — Z79899 Other long term (current) drug therapy: Secondary | ICD-10-CM | POA: Diagnosis not present

## 2019-03-20 DIAGNOSIS — E119 Type 2 diabetes mellitus without complications: Secondary | ICD-10-CM | POA: Diagnosis not present

## 2019-03-20 DIAGNOSIS — R740 Nonspecific elevation of levels of transaminase and lactic acid dehydrogenase [LDH]: Secondary | ICD-10-CM | POA: Diagnosis not present

## 2019-03-25 DIAGNOSIS — G4733 Obstructive sleep apnea (adult) (pediatric): Secondary | ICD-10-CM | POA: Diagnosis not present

## 2020-04-27 ENCOUNTER — Other Ambulatory Visit: Payer: Self-pay | Admitting: Internal Medicine

## 2020-04-27 DIAGNOSIS — R7989 Other specified abnormal findings of blood chemistry: Secondary | ICD-10-CM

## 2020-05-13 ENCOUNTER — Other Ambulatory Visit: Payer: 59

## 2020-05-18 ENCOUNTER — Ambulatory Visit
Admission: RE | Admit: 2020-05-18 | Discharge: 2020-05-18 | Disposition: A | Discharge status: Home / Self Care | Payer: 59 | Source: Ambulatory Visit | Attending: Internal Medicine | Admitting: Internal Medicine

## 2020-05-18 DIAGNOSIS — R7989 Other specified abnormal findings of blood chemistry: Secondary | ICD-10-CM

## 2021-03-30 ENCOUNTER — Other Ambulatory Visit: Payer: Self-pay | Admitting: Surgical Oncology

## 2021-04-19 ENCOUNTER — Other Ambulatory Visit: Payer: 59

## 2021-05-10 ENCOUNTER — Other Ambulatory Visit: Payer: 59

## 2021-05-24 ENCOUNTER — Ambulatory Visit
Admission: RE | Admit: 2021-05-24 | Discharge: 2021-05-24 | Disposition: A | Discharge status: Home / Self Care | Payer: 59 | Source: Ambulatory Visit | Attending: Surgical Oncology | Admitting: Surgical Oncology

## 2022-12-07 IMAGING — RF DG UGI W/ HIGH DENSITY W/O KUB
6 of 7 series · 14 of 23 positions shown · non-contrast
Comparison: None.

CLINICAL DATA: Morbid obesity.  Rule out hiatal hernia.

EXAM:
UPPER GI SERIES WITH KUB
TECHNIQUE: After obtaining a scout radiograph a routine upper GI series was
performed using thin and high density barium.
FLUOROSCOPY TIME:  Fluoroscopy Time:  1 minutes 36 second
Radiation Exposure Index (if provided by the fluoroscopic device):
Number of Acquired Spot Images: 7

[Series 1: one shot · 0.14mm/px · 1 of 1 slices shown (1 of 3)]
[im 1/1]
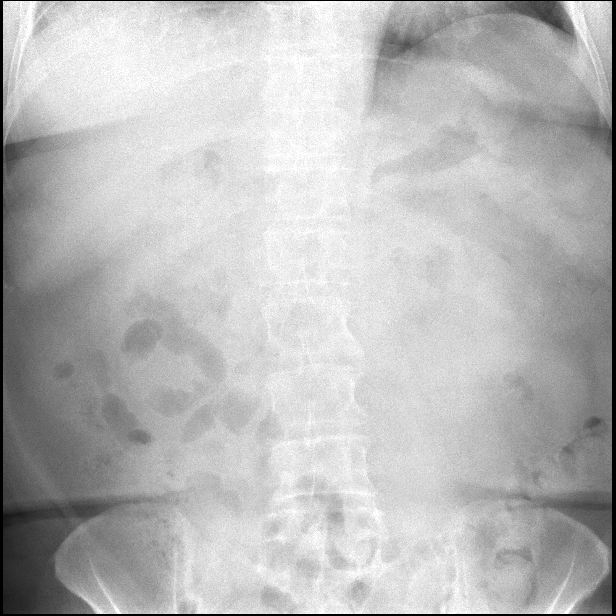

[Series 2: sequence · 0.29mm/px · 1 of 30 frames shown (1 of 3)]
[frame 16/30]
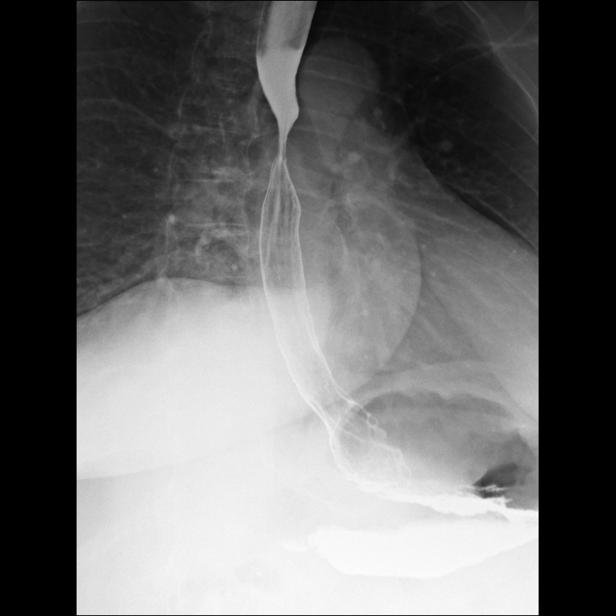

[Series 3: sequence · 0.29mm/px · 3 of 11 frames shown (2 of 3)]
[frame 2/11]
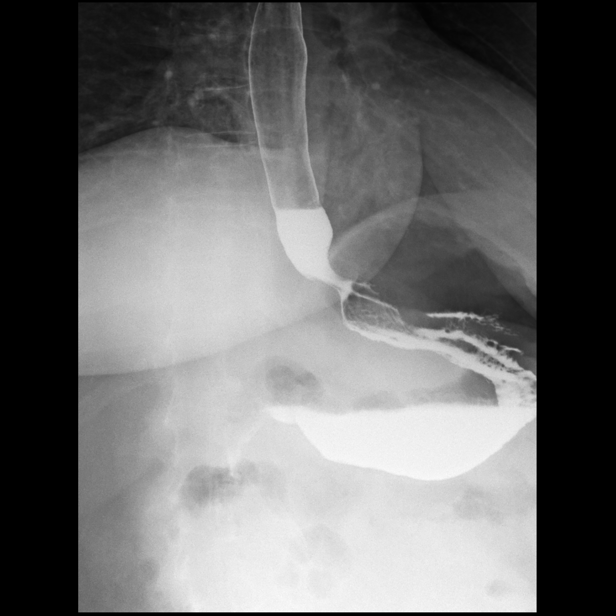
[frame 6/11]
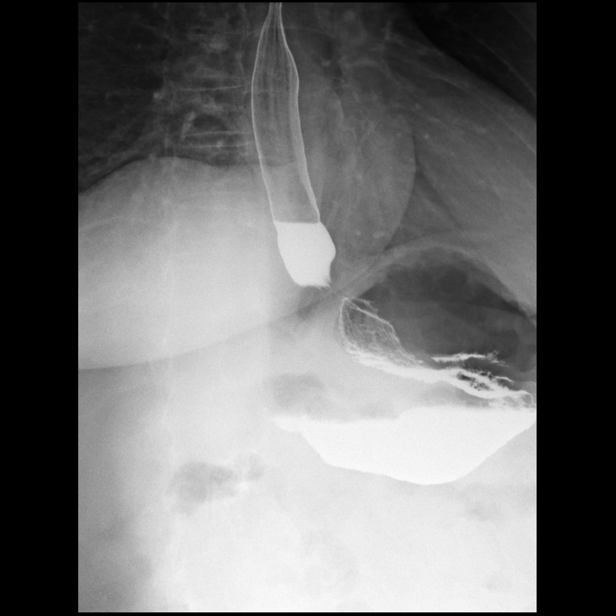
[frame 10/11]
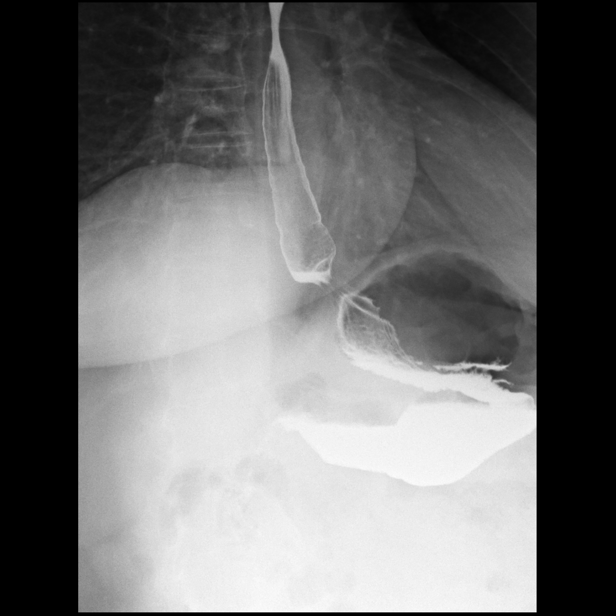

[Series 4: one shot · 0.16mm/px · 2 of 4 slices shown (2 of 3)]
[im 2/4]
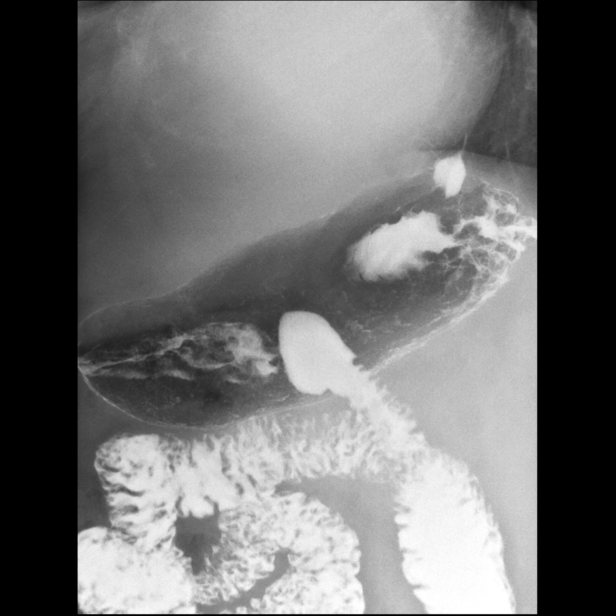
[im 3/4]
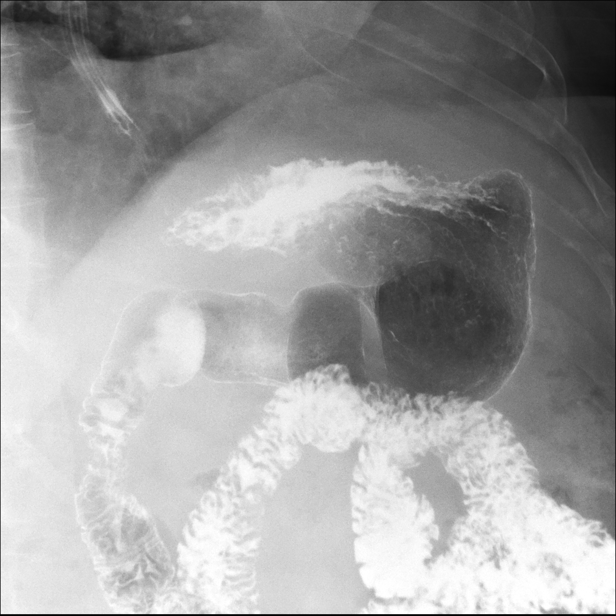

[Series 5: sequence · 0.29mm/px · 3 of 30 frames shown (3 of 3)]
[frame 5/30]
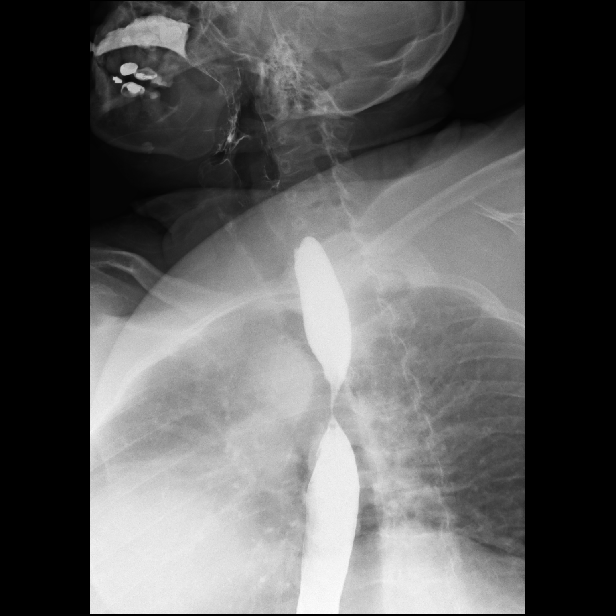
[frame 9/30]
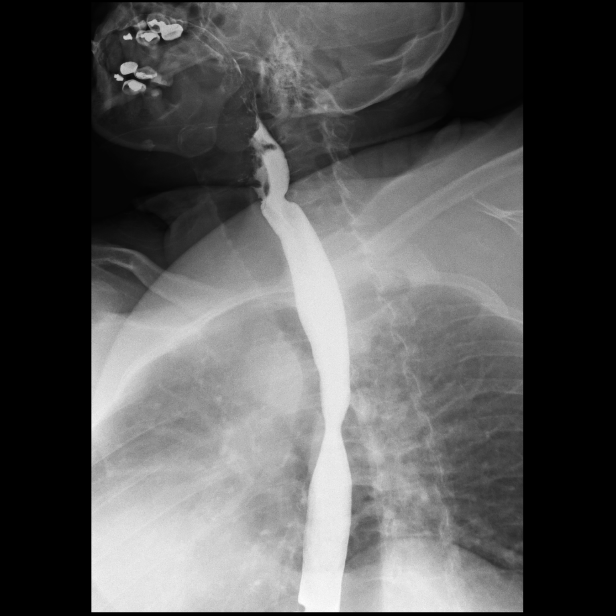
[frame 26/30]
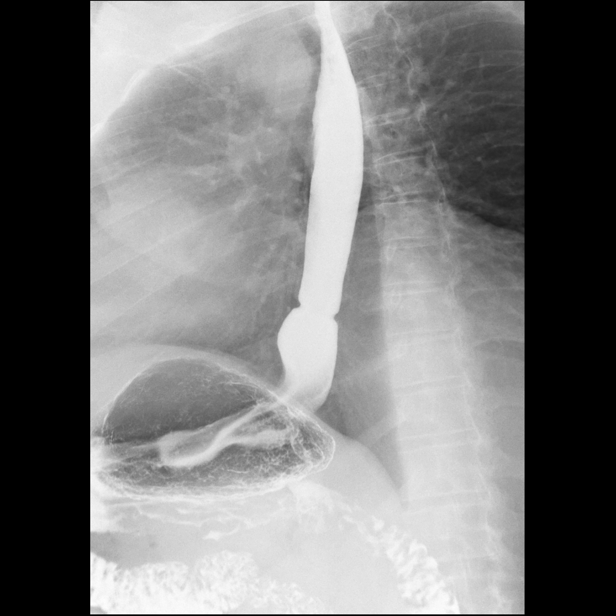

[Series 7: one shot · 0.15mm/px · 4 of 6 slices shown (3 of 3)]
[im 1/6]
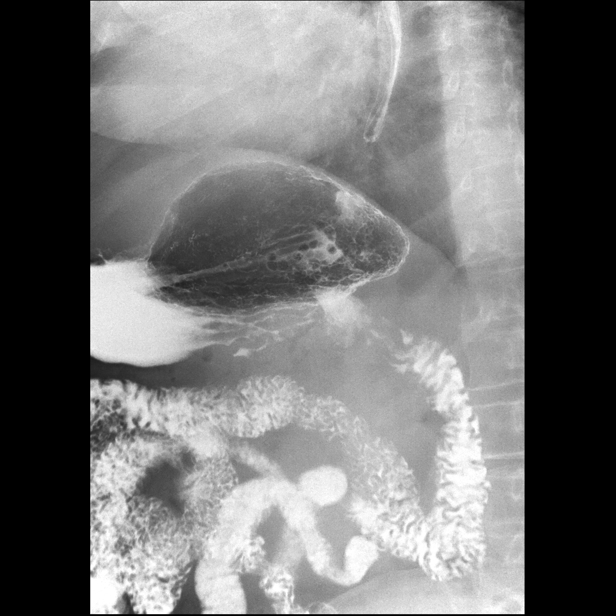
[im 2/6]
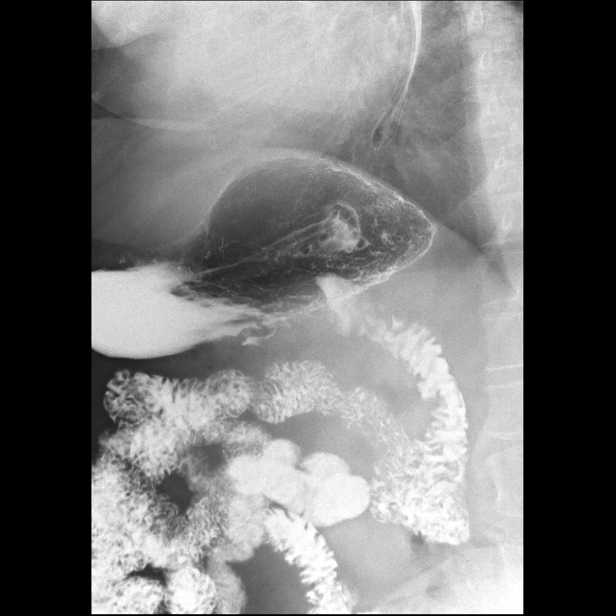
[im 4/6]
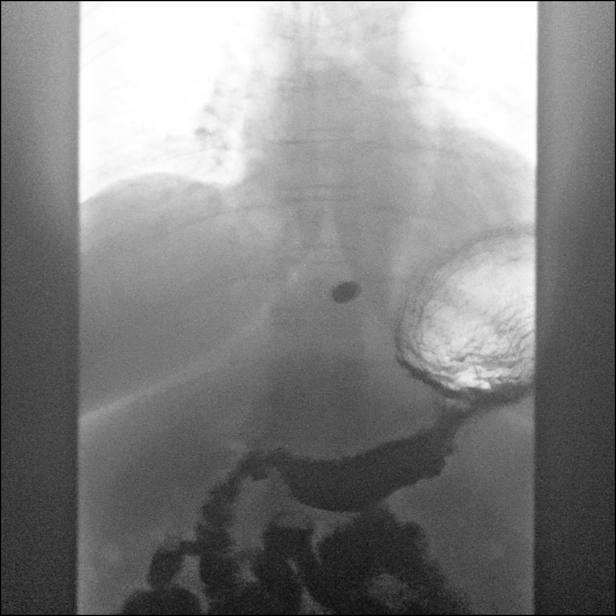
[im 6/6]
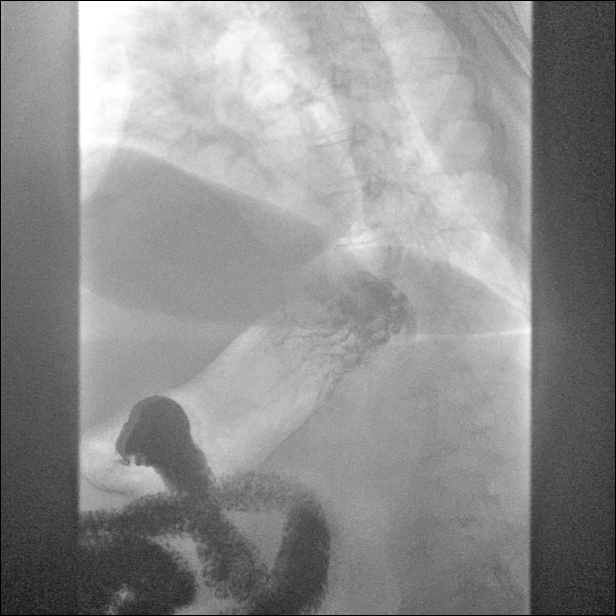

[14 of 23 positions shown; findings below may reference images not displayed]

FINDINGS: Preliminary KUB demonstrates normal bowel gas pattern. No urinary
tract calculi.

Esophageal mucosa and motility normal. No stricture or mass in the
esophagus. There is a small sliding hiatal hernia without stricture.
Barium tablet passed readily into the stomach. No gastroesophageal
reflux demonstrated

Normal stomach. No gastric mass or edema. Duodenal bulb normal.
Rapid emptying of the stomach.
IMPRESSION: Small hiatal hernia.  Negative for esophageal stricture or reflux

Normal stomach and duodenum.

## 2023-05-02 ENCOUNTER — Encounter: Payer: Self-pay | Admitting: Ophthalmology

## 2023-05-02 ENCOUNTER — Other Ambulatory Visit: Payer: Self-pay | Admitting: Ophthalmology

## 2023-05-02 DIAGNOSIS — H05241 Constant exophthalmos, right eye: Secondary | ICD-10-CM

## 2023-05-02 DIAGNOSIS — H05242 Constant exophthalmos, left eye: Secondary | ICD-10-CM

## 2023-06-12 ENCOUNTER — Ambulatory Visit
Admission: RE | Admit: 2023-06-12 | Discharge: 2023-06-12 | Disposition: A | Discharge status: Home / Self Care | Payer: 59 | Source: Ambulatory Visit | Attending: Ophthalmology | Admitting: Ophthalmology

## 2023-06-12 DIAGNOSIS — H05241 Constant exophthalmos, right eye: Secondary | ICD-10-CM

## 2023-06-12 DIAGNOSIS — H05242 Constant exophthalmos, left eye: Secondary | ICD-10-CM
